# Patient Record
Sex: Male | Born: 1970
Health system: Southern US, Community
[De-identification: ages and names within clinical notes are randomized; demographics above are authoritative.]

## PROBLEM LIST (undated history)

## (undated) DIAGNOSIS — R079 Chest pain, unspecified: Secondary | ICD-10-CM

## (undated) DIAGNOSIS — E7800 Pure hypercholesterolemia, unspecified: Secondary | ICD-10-CM

## (undated) DIAGNOSIS — N289 Disorder of kidney and ureter, unspecified: Secondary | ICD-10-CM

## (undated) DIAGNOSIS — I10 Essential (primary) hypertension: Secondary | ICD-10-CM

## (undated) HISTORY — PX: NO PAST SURGERIES: SHX2092

## (undated) HISTORY — DX: Chest pain, unspecified: R07.9

## (undated) HISTORY — DX: Essential (primary) hypertension: I10

---

## 2009-06-13 ENCOUNTER — Emergency Department (HOSPITAL_BASED_OUTPATIENT_CLINIC_OR_DEPARTMENT_OTHER): Admission: EM | Admit: 2009-06-13 | Discharge: 2009-06-13 | Payer: Self-pay | Admitting: Emergency Medicine

## 2010-11-17 LAB — CULTURE, ROUTINE-ABSCESS

## 2015-12-10 DIAGNOSIS — N181 Chronic kidney disease, stage 1: Secondary | ICD-10-CM | POA: Diagnosis not present

## 2015-12-10 DIAGNOSIS — E559 Vitamin D deficiency, unspecified: Secondary | ICD-10-CM | POA: Diagnosis not present

## 2015-12-13 DIAGNOSIS — E559 Vitamin D deficiency, unspecified: Secondary | ICD-10-CM | POA: Diagnosis not present

## 2015-12-13 DIAGNOSIS — N181 Chronic kidney disease, stage 1: Secondary | ICD-10-CM | POA: Diagnosis not present

## 2015-12-13 DIAGNOSIS — I10 Essential (primary) hypertension: Secondary | ICD-10-CM | POA: Diagnosis not present

## 2016-07-03 DIAGNOSIS — N181 Chronic kidney disease, stage 1: Secondary | ICD-10-CM | POA: Diagnosis not present

## 2016-07-03 DIAGNOSIS — E559 Vitamin D deficiency, unspecified: Secondary | ICD-10-CM | POA: Diagnosis not present

## 2016-07-11 DIAGNOSIS — E559 Vitamin D deficiency, unspecified: Secondary | ICD-10-CM | POA: Diagnosis not present

## 2016-07-11 DIAGNOSIS — N181 Chronic kidney disease, stage 1: Secondary | ICD-10-CM | POA: Diagnosis not present

## 2016-07-11 DIAGNOSIS — I10 Essential (primary) hypertension: Secondary | ICD-10-CM | POA: Diagnosis not present

## 2016-07-11 DIAGNOSIS — R809 Proteinuria, unspecified: Secondary | ICD-10-CM | POA: Diagnosis not present

## 2016-12-28 DIAGNOSIS — N181 Chronic kidney disease, stage 1: Secondary | ICD-10-CM | POA: Diagnosis not present

## 2016-12-28 DIAGNOSIS — E559 Vitamin D deficiency, unspecified: Secondary | ICD-10-CM | POA: Diagnosis not present

## 2016-12-28 DIAGNOSIS — R809 Proteinuria, unspecified: Secondary | ICD-10-CM | POA: Diagnosis not present

## 2017-01-02 DIAGNOSIS — N181 Chronic kidney disease, stage 1: Secondary | ICD-10-CM | POA: Diagnosis not present

## 2017-01-02 DIAGNOSIS — E559 Vitamin D deficiency, unspecified: Secondary | ICD-10-CM | POA: Diagnosis not present

## 2017-01-02 DIAGNOSIS — I10 Essential (primary) hypertension: Secondary | ICD-10-CM | POA: Diagnosis not present

## 2017-01-02 DIAGNOSIS — R809 Proteinuria, unspecified: Secondary | ICD-10-CM | POA: Diagnosis not present

## 2017-04-17 DIAGNOSIS — M62830 Muscle spasm of back: Secondary | ICD-10-CM | POA: Diagnosis not present

## 2017-04-17 DIAGNOSIS — I10 Essential (primary) hypertension: Secondary | ICD-10-CM | POA: Diagnosis not present

## 2017-04-17 DIAGNOSIS — M546 Pain in thoracic spine: Secondary | ICD-10-CM | POA: Diagnosis not present

## 2017-10-26 ENCOUNTER — Other Ambulatory Visit: Payer: Self-pay

## 2017-10-26 ENCOUNTER — Emergency Department (HOSPITAL_BASED_OUTPATIENT_CLINIC_OR_DEPARTMENT_OTHER)
Admission: EM | Admit: 2017-10-26 | Discharge: 2017-10-26 | Disposition: A | Discharge status: Home / Self Care | Payer: BLUE CROSS/BLUE SHIELD | Attending: Emergency Medicine | Admitting: Emergency Medicine

## 2017-10-26 ENCOUNTER — Encounter (HOSPITAL_BASED_OUTPATIENT_CLINIC_OR_DEPARTMENT_OTHER): Payer: Self-pay | Admitting: Emergency Medicine

## 2017-10-26 ENCOUNTER — Emergency Department (HOSPITAL_BASED_OUTPATIENT_CLINIC_OR_DEPARTMENT_OTHER): Payer: BLUE CROSS/BLUE SHIELD

## 2017-10-26 DIAGNOSIS — Z79899 Other long term (current) drug therapy: Secondary | ICD-10-CM | POA: Diagnosis not present

## 2017-10-26 DIAGNOSIS — I10 Essential (primary) hypertension: Secondary | ICD-10-CM | POA: Insufficient documentation

## 2017-10-26 DIAGNOSIS — R002 Palpitations: Secondary | ICD-10-CM

## 2017-10-26 DIAGNOSIS — R42 Dizziness and giddiness: Secondary | ICD-10-CM | POA: Diagnosis not present

## 2017-10-26 LAB — TSH: TSH: 0.901 u[IU]/mL (ref 0.350–4.500)

## 2017-10-26 LAB — CBC WITH DIFFERENTIAL/PLATELET
BASOS PCT: 1 %
Basophils Absolute: 0 10*3/uL (ref 0.0–0.1)
EOS ABS: 0.5 10*3/uL (ref 0.0–0.7)
Eosinophils Relative: 8 %
HEMATOCRIT: 42.5 % (ref 39.0–52.0)
HEMOGLOBIN: 15.4 g/dL (ref 13.0–17.0)
LYMPHS ABS: 2.7 10*3/uL (ref 0.7–4.0)
Lymphocytes Relative: 46 %
MCH: 30.5 pg (ref 26.0–34.0)
MCHC: 36.2 g/dL — AB (ref 30.0–36.0)
MCV: 84.2 fL (ref 78.0–100.0)
MONOS PCT: 12 %
Monocytes Absolute: 0.7 10*3/uL (ref 0.1–1.0)
NEUTROS ABS: 1.9 10*3/uL (ref 1.7–7.7)
Neutrophils Relative %: 33 %
Platelets: 270 10*3/uL (ref 150–400)
RBC: 5.05 MIL/uL (ref 4.22–5.81)
RDW: 12.9 % (ref 11.5–15.5)
WBC: 5.8 10*3/uL (ref 4.0–10.5)

## 2017-10-26 LAB — BASIC METABOLIC PANEL
ANION GAP: 10 (ref 5–15)
BUN: 13 mg/dL (ref 6–20)
CALCIUM: 9 mg/dL (ref 8.9–10.3)
CHLORIDE: 103 mmol/L (ref 101–111)
CO2: 24 mmol/L (ref 22–32)
CREATININE: 1.4 mg/dL — AB (ref 0.61–1.24)
GFR calc non Af Amer: 59 mL/min — ABNORMAL LOW (ref >60)
Glucose, Bld: 126 mg/dL — ABNORMAL HIGH (ref 65–99)
Potassium: 3.1 mmol/L — ABNORMAL LOW (ref 3.5–5.1)
SODIUM: 137 mmol/L (ref 135–145)

## 2017-10-26 LAB — TROPONIN I: Troponin I: <0.03 ng/mL (ref <0.03)

## 2017-10-26 LAB — MAGNESIUM: MAGNESIUM: 1.9 mg/dL (ref 1.7–2.4)

## 2017-10-26 MED ORDER — SODIUM CHLORIDE 0.9 % IV BOLUS (SEPSIS)
1000.0000 mL | Freq: Once | INTRAVENOUS | Status: AC
  Administered 2017-10-26: 1000 mL via INTRAVENOUS

## 2017-10-26 MED ORDER — POTASSIUM CHLORIDE CRYS ER 20 MEQ PO TBCR
20.0000 meq | EXTENDED_RELEASE_TABLET | Freq: Once | ORAL | Status: AC
Start: 2017-10-26 — End: 2017-10-26
  Administered 2017-10-26: 20 meq via ORAL
  Filled 2017-10-26: qty 1

## 2017-10-26 NOTE — ED Notes (Signed)
Pt ambulated to restroom with no difficulty.

## 2017-10-26 NOTE — ED Provider Notes (Signed)
[facility] EMERGENCY DEPARTMENT Provider Note   CSN: [id number] Arrival date & time: 10/26/17  1151     History   Chief Complaint Chief Complaint  Patient presents with  . Palpitations    HPI Scott Mcpherson is a 47 y.o. male.  He has a past medical history of hypertension.  He comes to the emergency department complaining of acute onset about 30 minutes ago of palpitations.  States they occurred at rest he felt very uncomfortable sensation in his chest with a hard noticeable heartbeat that lasted for a few minutes.  It spontaneously resolved and then recurred once again a few minutes later.  It is not come back since then.  It was associated with some lightheadedness that he still feeling.  There was no syncope.  No prior history of same.  He states everybody in the house has had some viral illness and he has been taking TheraFlu and Alka-Seltzer cold.   The history is provided by the patient.  Palpitations   This is a new problem. The current episode started less than 1 hour ago. The problem has been resolved. The problem is associated with an unknown factor. Associated symptoms include dizziness. Pertinent negatives include no diaphoresis, no fever, no chest pain, no syncope, no abdominal pain, no nausea, no vomiting, no back pain, no cough and no shortness of breath. He has tried bed rest for the symptoms. The treatment provided moderate relief. Risk factors include being male. His past medical history does not include anemia, heart disease, hyperthyroidism or valve disorder.    Past Medical History:  Diagnosis Date  . Benign hypertension   . Chest pain     There are no active problems to display for this patient.   History reviewed. No pertinent surgical history.     Home Medications    Prior to Admission medications   Medication Sig Start Date End Date Taking? Authorizing Provider  amLODipine (NORVASC) 10 MG tablet Take 10 mg by mouth daily.   Yes [provider]  losartan (COZAAR) 25 MG tablet Take 25 mg by mouth daily.   Yes [provider]  HYDROCHLOROTHIAZIDE PO Take by mouth. HCTZ 12.5 MG Tablet 1 tablet q AM    [provider]  OVER THE COUNTER MEDICATION mega men sport mvi daily,rapid drive amino complex 3 to 4 times a week,wheybolic extreme,pure protein bars as directed    [provider]    Family History Family History  Problem Relation Age of Onset  . Hypertension Mother   . Hypertension Father   . Hypertension Brother   . Hypertension Brother   . Diabetes Brother   . Cystic fibrosis Brother     Social History Social History   Tobacco Use  . Smoking status: Never Smoker  . Smokeless tobacco: Never Used  Substance Use Topics  . Alcohol use: Yes    Alcohol/week: 1.2 oz    Types: 2 Glasses of wine per week    Frequency: Never  . Drug use: No     Allergies   Other   Review of Systems Review of Systems  Constitutional: Negative for chills, diaphoresis and fever.  HENT: Negative for ear pain and sore throat.   Eyes: Negative for pain and visual disturbance.  Respiratory: Negative for cough and shortness of breath.   Cardiovascular: Positive for palpitations. Negative for chest pain and syncope.  Gastrointestinal: Negative for abdominal pain, nausea and vomiting.  Genitourinary: Negative for dysuria and hematuria.  Musculoskeletal: Negative for arthralgias and back pain.  Skin: Negative for color change and rash.  Neurological: Positive for dizziness. Negative for seizures and syncope.  All other systems reviewed and are negative.    Physical Exam Updated Vital Signs BP (!) 163/98 (BP Location: Left Arm)   Pulse 73   Temp 98.5 F (36.9 C) (Oral)   Resp 18   Ht 6\' 5"  (1.956 m)   Wt 108 kg (238 lb)   SpO2 100%   BMI 28.22 kg/m   Physical Exam  Constitutional: He appears well-developed and well-nourished.  HENT:  Head: Normocephalic and atraumatic.  Eyes:  Conjunctivae are normal.  Neck: Neck supple.  Cardiovascular: Normal rate and regular rhythm.  No murmur heard. Pulses:      Radial pulses are 2+ on the right side, and 2+ on the left side.       Dorsalis pedis pulses are 2+ on the right side, and 2+ on the left side.  Pulmonary/Chest: Effort normal and breath sounds normal. No respiratory distress.  Abdominal: Soft. There is no tenderness.  Musculoskeletal: He exhibits no edema or tenderness.  Neurological: He is alert.  Skin: Skin is warm and dry. Capillary refill takes less than 2 seconds.  Psychiatric: He has a normal mood and affect.  Nursing note and vitals reviewed.    ED Treatments / Results  Labs (all labs ordered are listed, but only abnormal results are displayed) Labs Reviewed  BASIC METABOLIC PANEL - Abnormal; Notable for the following components:      Result Value   Potassium 3.1 (*)    Glucose, Bld 126 (*)    Creatinine, Ser 1.40 (*)    GFR calc non Af Amer 59 (*)    All other components within normal limits  CBC WITH DIFFERENTIAL/PLATELET - Abnormal; Notable for the following components:   MCHC 36.2 (*)    All other components within normal limits  TROPONIN I  MAGNESIUM  TSH  TROPONIN I    EKG  EKG Interpretation  Date/Time:  Friday October 26 2017 12:05:12 EDT Ventricular Rate:  67 PR Interval:    QRS Duration: 87 QT Interval:  383 QTC Calculation: 405 R Axis:   102 Text Interpretation:  Sinus rhythm Right axis deviation Borderline repolarization abnormality diffuse t wave inversions no prior to compare with Confirmed by Meridee ScoreButler, Michael [id number]) on 10/26/2017 12:10:38 PM       Radiology Dg Chest 2 View  Result Date: 10/26/2017 CLINICAL DATA:  Palpitations EXAM: CHEST - 2 VIEW COMPARISON:  None. FINDINGS: Normal heart size. Lungs clear. No pneumothorax. No pleural effusion. IMPRESSION: No active cardiopulmonary disease. Electronically Signed   By: Jolaine ClickArthur  Hoss M.D.   On: 10/26/2017 12:31     Procedures Procedures (including critical care time)  Medications Ordered in ED Medications  sodium chloride 0.9 % bolus 1,000 mL (not administered)     Initial Impression / Assessment and Plan / ED Course  I have reviewed the triage vital signs and the nursing notes.  Pertinent labs & imaging results that were available during my care of the patient were reviewed by me and considered in my medical decision making (see chart for details).  Clinical Course as of Oct 27 1921  Fri Oct 26, 2017  1339 I evaluated the patient's EKG and he does have diffuse T wave inversions both inferiorly and anteriorly.  There are no prior ones.  His potassium was a little low and he has been on much stimulants so  he is got multiple reasons to have an arrhythmia but I do feel like he at least deserves to troponins.  I reviewed this with the patient and he also remembers hearing that his EKG was abnormal prior when he did an EKG for insurance.  States he seen a cardiologist had a negative stress test but that was 5 years ago plus.  He is comfortable with getting a second troponin and if that is negative then we would refer him on to cardiology for consideration of a Holter monitor and further evaluation of this abnormal EKG.  [MB]  1519 Repeat EKG normal sinus rhythm at 59 with right axis deviation and T wave inversions anteriorly and flattening inferiorly similar to prior EKG.  [MB]  1550 Second troponin negative.  I reviewed all of the results with the patient and he is comfortable going home and following up with his doctor and cardiologist.  He understands to return if any worsening symptoms.  [MB]    Clinical Course User Index [MB] Terrilee Files, MD     Final Clinical Impressions(s) / ED Diagnoses   Final diagnoses:  Palpitations    ED Discharge Orders    None       Terrilee Files, MD 10/27/17 1924

## 2017-10-26 NOTE — ED Notes (Signed)
NAD at this time. Pt is stable and going home.  

## 2017-10-26 NOTE — Discharge Instructions (Signed)
Your evaluated in the emergency department for 2 episodes of palpitations.  Your workup was significant for a slightly low potassium.  You were given some potassium to help that.  Also your EKG showed some abnormality that will need to be followed up by your primary care doctor and likely cardiology.  The blood work did not show any evidence of any cardiac injury.  We recommend you take your regular medications and call your doctor on Monday for follow-up.  If your palpitations symptoms recur or any other concerns before then please return to the emergency department for reevaluation.  He should also be careful of other over-the-counter medications because many of the cold preparations can cause the heart to have some irregularity to it.

## 2017-10-26 NOTE — ED Triage Notes (Signed)
Patient states that after he ate breakfast he felt like his heart was beating fast. Reports that it is better at this time

## 2017-11-16 ENCOUNTER — Ambulatory Visit (INDEPENDENT_AMBULATORY_CARE_PROVIDER_SITE_OTHER): Payer: BLUE CROSS/BLUE SHIELD | Admitting: Cardiology

## 2017-11-16 ENCOUNTER — Encounter: Payer: Self-pay | Admitting: Cardiology

## 2017-11-16 VITALS — BP 136/76 | HR 70 | Ht 77.0 in | Wt 251.0 lb

## 2017-11-16 DIAGNOSIS — N182 Chronic kidney disease, stage 2 (mild): Secondary | ICD-10-CM | POA: Diagnosis not present

## 2017-11-16 DIAGNOSIS — N189 Chronic kidney disease, unspecified: Secondary | ICD-10-CM | POA: Insufficient documentation

## 2017-11-16 DIAGNOSIS — R0683 Snoring: Secondary | ICD-10-CM

## 2017-11-16 DIAGNOSIS — R002 Palpitations: Secondary | ICD-10-CM | POA: Diagnosis not present

## 2017-11-16 DIAGNOSIS — R9431 Abnormal electrocardiogram [ECG] [EKG]: Secondary | ICD-10-CM

## 2017-11-16 NOTE — Patient Instructions (Addendum)
Medication Instructions:  Your physician recommends that you continue on your current medications as directed. Please refer to the Current Medication list given to you today.   Labwork: Your physician recommends that you return for lab work when you come back for your event recorder appointment: lipid panel. Please fast before lab work.   Testing/Procedures: Your physician has requested that you have an echocardiogram. Echocardiography is a painless test that uses sound waves to create images of your heart. It provides your doctor with information about the size and shape of your heart and how well your heart's chambers and valves are working. This procedure takes approximately one hour. There are no restrictions for this procedure.  Your physician has recommended that you wear an event monitor. Event monitors are medical devices that record the heart's electrical activity. Doctors most often us these monitors to diagnose arrhythmias. Arrhythmias are problems with the speed or rhythm of the heartbeat. The monitor is a small, portable device. You can wear one while you do your normal daily activities. This is usually used to diagnose what is causing palpitations/syncope (passing out). Wear 30 days.   Follow-Up: Your physician recommends that you schedule a follow-up appointment in: 6 weeks.   Any Other Special Instructions Will Be Listed Below (If Applicable).  You have been referred to have a sleep study. You will be contacted to schedule this appointment.    If you need a refill on your cardiac medications before your next appointment, please call your pharmacy.

## 2017-11-16 NOTE — Progress Notes (Signed)
Cardiology Consultation:    Date:  11/16/2017   ID:  Scott Mcpherson, DOB [birth year], MRN [id number]  PCP:  Patient, No Pcp Per  Cardiologist:  Gypsy Balsamobert Gamble Enderle, MD   Referring MD: No ref. provider found   Chief Complaint  Patient presents with  . Tachycardia    Follow up from ED,  had episode yesterday, and this morning  . Dizziness  . Hypertension  I had palpitations  History of Present Illness:    Scott KiltsDamien L Druckenmiller is a 47 y.o. male who is being seen today for the evaluation of palpitations at the request of No ref. provider found.  Last week he was sick with a cold.  He took some medication which included DayQuil.  After that he was sitting in the diet and started being aware of his heart beating very forcefully he did not is especially fast but simply forcefully he got up to try to walk around was a little dizzy there was no shortness of breath no sweating associated with this sensation he did not feel well.  Eventually decided to go to the emergency room.  In the emergency room he was examined and batteries of tests were done all were negative his thyroid was normal troponin was negative.  He was sent home.  However yesterday again he started experiencing this forceful beating in his chest.  He became more than decided to come and be seen.  Apparently 2 years ago he applied for life insurance and he had to pay higher premium because of abnormalities of EKG.  EKG done in the emergency room showed normal sinus rhythm normal P interval repolarization abnormalities.  With some nonspecific T wave changes.  Does not exercise on the regular basis but since he started having this problem he started walking on the regular basis have no difficulty doing it. He snores a lot but does not know if he stops breathing at night.  Past Medical History:  Diagnosis Date  . Benign hypertension   . Chest pain     Past Surgical History:  Procedure Laterality Date  . NO PAST SURGERIES      Current  Medications: Current Meds  Medication Sig  . amLODipine-benazepril (LOTREL) 10-40 MG capsule Take 1 capsule by mouth daily.  Marland Kitchen. losartan (COZAAR) 25 MG tablet Take 25 mg by mouth daily.     Allergies:   Other and Pollen extract   Social History   Socioeconomic History  . Marital status: Married    Spouse name: Not on file  . Number of children: Not on file  . Years of education: Not on file  . Highest education level: Not on file  Occupational History  . Not on file  Social Needs  . Financial resource strain: Not on file  . Food insecurity:    Worry: Not on file    Inability: Not on file  . Transportation needs:    Medical: Not on file    Non-medical: Not on file  Tobacco Use  . Smoking status: Never Smoker  . Smokeless tobacco: Never Used  Substance and Sexual Activity  . Alcohol use: Yes    Alcohol/week: 1.2 oz    Types: 2 Glasses of wine per week    Frequency: Never  . Drug use: No  . Sexual activity: Not on file  Lifestyle  . Physical activity:    Days per week: Not on file    Minutes per session: Not on file  . Stress: Not  on file  Relationships  . Social connections:    Talks on phone: Not on file    Gets together: Not on file    Attends religious service: Not on file    Active member of club or organization: Not on file    Attends meetings of clubs or organizations: Not on file    Relationship status: Not on file  Other Topics Concern  . Not on file  Social History Narrative  . Not on file     Family History: The patient's family history includes Cystic fibrosis in his brother; Diabetes in his brother; Hypertension in his brother, brother, father, and mother. ROS:   Please see the history of present illness.    All 14 point review of systems negative except as described per history of present illness.  EKGs/Labs/Other Studies Reviewed:    The following studies were reviewed today:     Recent Labs: 10/26/2017: BUN 13; Creatinine, Ser 1.40;  Hemoglobin 15.4; Magnesium 1.9; Platelets 270; Potassium 3.1; Sodium 137; TSH 0.901  Recent Lipid Panel No results found for: CHOL, TRIG, HDL, CHOLHDL, VLDL, LDLCALC, LDLDIRECT  Physical Exam:    VS:  BP 136/76 (BP Location: Left Arm)   Pulse 70   Ht 6\' 5"  (1.956 m)   Wt 251 lb (113.9 kg)   SpO2 97%   BMI 29.76 kg/m     Wt Readings from Last 3 Encounters:  11/16/17 251 lb (113.9 kg)  10/26/17 238 lb (108 kg)     GEN:  Well nourished, well developed in no acute distress HEENT: Normal NECK: No JVD; No carotid bruits LYMPHATICS: No lymphadenopathy CARDIAC: RRR, no murmurs, no rubs, no gallops RESPIRATORY:  Clear to auscultation without rales, wheezing or rhonchi  ABDOMEN: Soft, non-tender, non-distended MUSCULOSKELETAL:  No edema; No deformity  SKIN: Warm and dry NEUROLOGIC:  Alert and oriented x 3 PSYCHIATRIC:  Normal affect   ASSESSMENT:    1. Palpitations   2. Abnormal EKG   3. Chronic renal failure, stage 2 (mild)   4. Snoring    PLAN:    In order of problems listed above:  1. Palpitations: I will ask him to wear event recorder to see if there is any significant abnormality.  I suspect it may be related simply to the fact that he had a cold may be was somewhat dehydrated and was taking also decongestant.  However with abnormality of the EKG I think it would be reasonable to perform monitor. 2. Abnormal EKG: He will be scheduled to have echocardiogram to assess left ventricular ejection fraction. 3. Chronic renal failure: Stable followed by nephrology.  On ARB. 4. Snoring: We will schedule him to have a sleep study.   Medication Adjustments/Labs and Tests Ordered: Current medicines are reviewed at length with the patient today.  Concerns regarding medicines are outlined above.  Orders Placed This Encounter  Procedures  . Lipid Profile  . Cardiac event monitor  . ECHOCARDIOGRAM COMPLETE   No orders of the defined types were placed in this  encounter.   Signed, Georgeanna Lea, MD, [facility]. 11/16/2017 1:03 PM    [facility]

## 2017-11-20 DIAGNOSIS — R002 Palpitations: Secondary | ICD-10-CM | POA: Diagnosis not present

## 2017-11-21 LAB — LIPID PANEL
CHOLESTEROL TOTAL: 234 mg/dL — AB (ref 100–199)
Chol/HDL Ratio: 3.3 ratio (ref 0.0–5.0)
HDL: 70 mg/dL (ref >39)
LDL Calculated: 146 mg/dL — ABNORMAL HIGH (ref 0–99)
Triglycerides: 91 mg/dL (ref 0–149)
VLDL Cholesterol Cal: 18 mg/dL (ref 5–40)

## 2018-01-01 ENCOUNTER — Ambulatory Visit (INDEPENDENT_AMBULATORY_CARE_PROVIDER_SITE_OTHER): Payer: BLUE CROSS/BLUE SHIELD | Admitting: Cardiology

## 2018-01-01 ENCOUNTER — Encounter: Payer: Self-pay | Admitting: Cardiology

## 2018-01-01 VITALS — BP 130/78 | HR 75 | Ht 77.0 in | Wt 253.0 lb

## 2018-01-01 DIAGNOSIS — R002 Palpitations: Secondary | ICD-10-CM | POA: Diagnosis not present

## 2018-01-01 DIAGNOSIS — R9431 Abnormal electrocardiogram [ECG] [EKG]: Secondary | ICD-10-CM | POA: Diagnosis not present

## 2018-01-01 NOTE — Patient Instructions (Signed)
Medication Instructions:  Your physician recommends that you continue on your current medications as directed. Please refer to the Current Medication list given to you today.   Labwork: None  Testing/Procedures: Your physician has requested that you have an echocardiogram. Echocardiography is a painless test that uses sound waves to create images of your heart. It provides your doctor with information about the size and shape of your heart and how well your heart's chambers and valves are working. This procedure takes approximately one hour. There are no restrictions for this procedure.  Follow-Up: Your physician wants you to follow-up in: 3 months. You will receive a reminder letter in the mail two months in advance. If you don't receive a letter, please call our office to schedule the follow-up appointment.   If you need a refill on your cardiac medications before your next appointment, please call your pharmacy.   Thank you for choosing [facility]! Airyonna Franklyn, RN [phone]    

## 2018-01-01 NOTE — Progress Notes (Signed)
Cardiology Office Note:    Date:  01/01/2018   ID:  Scott Mcpherson, DOB [birth year], MRN [id number]  PCP:  Patient, No Pcp Per  Cardiologist:  Gypsy Balsam, MD    Referring MD: No ref. provider found   Chief Complaint  Patient presents with  . 6 week follow up  Better  History of Present Illness:    Scott Mcpherson is a 47 y.o. male with palpitations.  He wear Holter monitor but did not show any significant arrhythmia.  Echocardiogram still was not done awaiting for the test.  Overall he is doing much better he started exercising again have no difficulty doing it  Past Medical History:  Diagnosis Date  . Benign hypertension   . Chest pain     Past Surgical History:  Procedure Laterality Date  . NO PAST SURGERIES      Current Medications: Current Meds  Medication Sig  . amLODipine-benazepril (LOTREL) 10-40 MG capsule Take 1 capsule by mouth daily.  Marland Kitchen loratadine (CLARITIN) 10 MG tablet Take 10 mg by mouth daily.  Marland Kitchen losartan (COZAAR) 25 MG tablet Take 25 mg by mouth daily.     Allergies:   Other and Pollen extract   Social History   Socioeconomic History  . Marital status: Married    Spouse name: Not on file  . Number of children: Not on file  . Years of education: Not on file  . Highest education level: Not on file  Occupational History  . Not on file  Social Needs  . Financial resource strain: Not on file  . Food insecurity:    Worry: Not on file    Inability: Not on file  . Transportation needs:    Medical: Not on file    Non-medical: Not on file  Tobacco Use  . Smoking status: Never Smoker  . Smokeless tobacco: Never Used  Substance and Sexual Activity  . Alcohol use: Yes    Alcohol/week: 1.2 oz    Types: 2 Glasses of wine per week    Frequency: Never  . Drug use: No  . Sexual activity: Not on file  Lifestyle  . Physical activity:    Days per week: Not on file    Minutes per session: Not on file  . Stress: Not on file  Relationships  .  Social connections:    Talks on phone: Not on file    Gets together: Not on file    Attends religious service: Not on file    Active member of club or organization: Not on file    Attends meetings of clubs or organizations: Not on file    Relationship status: Not on file  Other Topics Concern  . Not on file  Social History Narrative  . Not on file     Family History: The patient's family history includes Cystic fibrosis in his brother; Diabetes in his brother; Hypertension in his brother, brother, father, and mother. ROS:   Please see the history of present illness.    All 14 point review of systems negative except as described per history of present illness  EKGs/Labs/Other Studies Reviewed:      Recent Labs: 10/26/2017: BUN 13; Creatinine, Ser 1.40; Hemoglobin 15.4; Magnesium 1.9; Platelets 270; Potassium 3.1; Sodium 137; TSH 0.901  Recent Lipid Panel    Component Value Date/Time   CHOL 234 (H) 11/20/2017 0840   TRIG 91 11/20/2017 0840   HDL 70 11/20/2017 0840   CHOLHDL 3.3 11/20/2017 0840  LDLCALC 146 (H) 11/20/2017 0840    Physical Exam:    VS:  BP 130/78   Pulse 75   Ht  (1.956 m)   Wt 253 lb (114.8 kg)   SpO2 97%   BMI 30.00 kg/m     Wt Readings from Last 3 Encounters:  01/01/18 253 lb (114.8 kg)  11/16/17 251 lb (113.9 kg)  10/26/17 238 lb (108 kg)     GEN:  Well nourished, well developed in no acute distress HEENT: Normal NECK: No JVD; No carotid bruits LYMPHATICS: No lymphadenopathy CARDIAC: RRR, no murmurs, no rubs, no gallops RESPIRATORY:  Clear to auscultation without rales, wheezing or rhonchi  ABDOMEN: Soft, non-tender, non-distended MUSCULOSKELETAL:  No edema; No deformity  SKIN: Warm and dry LOWER EXTREMITIES: no swelling NEUROLOGIC:  Alert and oriented x 3 PSYCHIATRIC:  Normal affect   ASSESSMENT:    1. Palpitations   2. Abnormal EKG    PLAN:    In order of problems listed above:  1. Palpitations: Denies having any event  recorder did not show any significant arrhythmia we will continue monitoring as a part of evaluation I will ask him to have echocardiogram done. 2. Abnormal EKG: We will do echocardiogram.  Him back in 3 months sooner if he get a problem   Medication Adjustments/Labs and Tests Ordered: Current medicines are reviewed at length with the patient today.  Concerns regarding medicines are outlined above.  No orders of the defined types were placed in this encounter.  Medication changes: No orders of the defined types were placed in this encounter.   Signed, Georgeanna Lea, MD, [facility] 01/01/2018 9:05 AM    [facility]

## 2018-01-02 ENCOUNTER — Telehealth: Payer: Self-pay | Admitting: Cardiology

## 2018-01-02 NOTE — Telephone Encounter (Signed)
Please call patient she needs to speak to you about scheduling his echo with another place.

## 2018-01-02 NOTE — Telephone Encounter (Signed)
Patient states he has been calling around trying to find the most affordable facility with his insurance company to perform his echocardiogram. He is interested in having this done at [facility] in [place]; however, he wants to see how much the cost would be to have the echocardiogram done here. Advised patient to call our billing department to get more information regarding cost. Patient states he will call back to let us know where he wants to schedule his echocardiogram. No further questions.

## 2018-02-26 DIAGNOSIS — E559 Vitamin D deficiency, unspecified: Secondary | ICD-10-CM | POA: Diagnosis not present

## 2018-02-26 DIAGNOSIS — I10 Essential (primary) hypertension: Secondary | ICD-10-CM | POA: Diagnosis not present

## 2018-02-26 DIAGNOSIS — N181 Chronic kidney disease, stage 1: Secondary | ICD-10-CM | POA: Diagnosis not present

## 2018-02-26 DIAGNOSIS — R809 Proteinuria, unspecified: Secondary | ICD-10-CM | POA: Diagnosis not present

## 2018-03-27 DIAGNOSIS — N181 Chronic kidney disease, stage 1: Secondary | ICD-10-CM | POA: Diagnosis not present

## 2018-11-20 DIAGNOSIS — N181 Chronic kidney disease, stage 1: Secondary | ICD-10-CM | POA: Diagnosis not present

## 2018-11-21 DIAGNOSIS — N181 Chronic kidney disease, stage 1: Secondary | ICD-10-CM | POA: Diagnosis not present

## 2018-11-21 DIAGNOSIS — E559 Vitamin D deficiency, unspecified: Secondary | ICD-10-CM | POA: Diagnosis not present

## 2018-11-21 DIAGNOSIS — R809 Proteinuria, unspecified: Secondary | ICD-10-CM | POA: Diagnosis not present

## 2018-11-21 DIAGNOSIS — I10 Essential (primary) hypertension: Secondary | ICD-10-CM | POA: Diagnosis not present

## 2019-02-17 DIAGNOSIS — R809 Proteinuria, unspecified: Secondary | ICD-10-CM | POA: Diagnosis not present

## 2019-02-17 DIAGNOSIS — N181 Chronic kidney disease, stage 1: Secondary | ICD-10-CM | POA: Diagnosis not present

## 2019-02-17 DIAGNOSIS — E559 Vitamin D deficiency, unspecified: Secondary | ICD-10-CM | POA: Diagnosis not present

## 2019-02-17 DIAGNOSIS — N182 Chronic kidney disease, stage 2 (mild): Secondary | ICD-10-CM | POA: Diagnosis not present

## 2019-02-17 DIAGNOSIS — I10 Essential (primary) hypertension: Secondary | ICD-10-CM | POA: Diagnosis not present

## 2019-05-13 DIAGNOSIS — L03032 Cellulitis of left toe: Secondary | ICD-10-CM | POA: Diagnosis not present

## 2019-05-13 DIAGNOSIS — L600 Ingrowing nail: Secondary | ICD-10-CM | POA: Diagnosis not present

## 2019-07-08 DIAGNOSIS — E559 Vitamin D deficiency, unspecified: Secondary | ICD-10-CM | POA: Diagnosis not present

## 2019-07-08 DIAGNOSIS — N182 Chronic kidney disease, stage 2 (mild): Secondary | ICD-10-CM | POA: Diagnosis not present

## 2019-07-08 DIAGNOSIS — R809 Proteinuria, unspecified: Secondary | ICD-10-CM | POA: Diagnosis not present

## 2019-07-08 DIAGNOSIS — I129 Hypertensive chronic kidney disease with stage 1 through stage 4 chronic kidney disease, or unspecified chronic kidney disease: Secondary | ICD-10-CM | POA: Diagnosis not present

## 2019-12-11 DIAGNOSIS — N182 Chronic kidney disease, stage 2 (mild): Secondary | ICD-10-CM | POA: Diagnosis not present

## 2019-12-11 DIAGNOSIS — E559 Vitamin D deficiency, unspecified: Secondary | ICD-10-CM | POA: Diagnosis not present

## 2019-12-17 DIAGNOSIS — N1831 Chronic kidney disease, stage 3a: Secondary | ICD-10-CM | POA: Diagnosis not present

## 2019-12-17 DIAGNOSIS — I129 Hypertensive chronic kidney disease with stage 1 through stage 4 chronic kidney disease, or unspecified chronic kidney disease: Secondary | ICD-10-CM | POA: Diagnosis not present

## 2019-12-17 DIAGNOSIS — R809 Proteinuria, unspecified: Secondary | ICD-10-CM | POA: Diagnosis not present

## 2019-12-19 ENCOUNTER — Other Ambulatory Visit (HOSPITAL_COMMUNITY): Payer: Self-pay | Admitting: Nephrology

## 2019-12-19 DIAGNOSIS — I129 Hypertensive chronic kidney disease with stage 1 through stage 4 chronic kidney disease, or unspecified chronic kidney disease: Secondary | ICD-10-CM

## 2019-12-19 DIAGNOSIS — N1831 Chronic kidney disease, stage 3a: Secondary | ICD-10-CM

## 2019-12-19 DIAGNOSIS — R809 Proteinuria, unspecified: Secondary | ICD-10-CM

## 2019-12-31 ENCOUNTER — Other Ambulatory Visit: Payer: Self-pay | Admitting: Student

## 2020-01-01 ENCOUNTER — Encounter (HOSPITAL_COMMUNITY): Payer: Self-pay

## 2020-01-01 ENCOUNTER — Ambulatory Visit (HOSPITAL_COMMUNITY)
Admission: RE | Admit: 2020-01-01 | Discharge: 2020-01-01 | Disposition: A | Discharge status: Home / Self Care | Payer: BC Managed Care – PPO | Source: Ambulatory Visit | Attending: Nephrology | Admitting: Nephrology

## 2020-01-01 ENCOUNTER — Other Ambulatory Visit: Payer: Self-pay

## 2020-01-01 DIAGNOSIS — Z7901 Long term (current) use of anticoagulants: Secondary | ICD-10-CM | POA: Diagnosis not present

## 2020-01-01 DIAGNOSIS — R809 Proteinuria, unspecified: Secondary | ICD-10-CM | POA: Diagnosis not present

## 2020-01-01 DIAGNOSIS — Z79899 Other long term (current) drug therapy: Secondary | ICD-10-CM | POA: Insufficient documentation

## 2020-01-01 DIAGNOSIS — N1831 Chronic kidney disease, stage 3a: Secondary | ICD-10-CM | POA: Insufficient documentation

## 2020-01-01 DIAGNOSIS — N189 Chronic kidney disease, unspecified: Secondary | ICD-10-CM | POA: Diagnosis not present

## 2020-01-01 DIAGNOSIS — Z8249 Family history of ischemic heart disease and other diseases of the circulatory system: Secondary | ICD-10-CM | POA: Diagnosis not present

## 2020-01-01 DIAGNOSIS — I129 Hypertensive chronic kidney disease with stage 1 through stage 4 chronic kidney disease, or unspecified chronic kidney disease: Secondary | ICD-10-CM | POA: Insufficient documentation

## 2020-01-01 LAB — CBC
HCT: 44.1 % (ref 39.0–52.0)
Hemoglobin: 15.2 g/dL (ref 13.0–17.0)
MCH: 30.3 pg (ref 26.0–34.0)
MCHC: 34.5 g/dL (ref 30.0–36.0)
MCV: 88 fL (ref 80.0–100.0)
Platelets: 307 10*3/uL (ref 150–400)
RBC: 5.01 MIL/uL (ref 4.22–5.81)
RDW: 12.9 % (ref 11.5–15.5)
WBC: 7 10*3/uL (ref 4.0–10.5)
nRBC: 0 % (ref 0.0–0.2)

## 2020-01-01 LAB — PROTIME-INR
INR: 1.1 (ref 0.8–1.2)
Prothrombin Time: 13.9 seconds (ref 11.4–15.2)

## 2020-01-01 MED ORDER — MIDAZOLAM HCL 2 MG/2ML IJ SOLN
INTRAMUSCULAR | Status: AC
  Filled 2020-01-01: qty 2

## 2020-01-01 MED ORDER — GELATIN ABSORBABLE 12-7 MM EX MISC
CUTANEOUS | Status: AC
  Filled 2020-01-01: qty 1

## 2020-01-01 MED ORDER — MIDAZOLAM HCL 2 MG/2ML IJ SOLN
INTRAMUSCULAR | Status: AC | PRN
  Administered 2020-01-01: 1 mg via INTRAVENOUS

## 2020-01-01 MED ORDER — SODIUM CHLORIDE 0.9 % IV SOLN: INTRAVENOUS | Status: DC

## 2020-01-01 MED ORDER — LIDOCAINE HCL (PF) 1 % IJ SOLN
INTRAMUSCULAR | Status: AC
  Filled 2020-01-01: qty 30

## 2020-01-01 MED ORDER — FENTANYL CITRATE (PF) 100 MCG/2ML IJ SOLN
INTRAMUSCULAR | Status: AC
  Filled 2020-01-01: qty 2

## 2020-01-01 MED ORDER — FENTANYL CITRATE (PF) 100 MCG/2ML IJ SOLN
INTRAMUSCULAR | Status: AC | PRN
  Administered 2020-01-01: 50 ug via INTRAVENOUS

## 2020-01-01 NOTE — H&P (Signed)
Chief Complaint: Patient was seen in consultation today for a random renal biopsy.  Referring Physician(s): Patel,Jay  Supervising Physician: Gilmer Mor  Patient Status: [facility] - Out-pt  History of Present Illness: Scott Mcpherson is a 49 y.o. male with a past medical history significant for HTN, asthma and seasonal allergies who presents today for a random renal biopsy. Scott Mcpherson has been followed by nephrology for persistent proteinuria, at his last visit on 12/17/19 he was also noted to have further worsening of his GFR. IR has been asked to perform a random renal biopsy to further evaluate for possible FSGS.  Scott Mcpherson denies any complaints today, he is nervous about the procedure but ready to proceed. He denies any hematuria, dysuria, abdominal or flank pain. He understands the reasoning behind the procedure today and wishes to proceed as planned.  Past Medical History:  Diagnosis Date  . Benign hypertension   . Chest pain     Past Surgical History:  Procedure Laterality Date  . NO PAST SURGERIES      Allergies: Other and Pollen extract  Medications: Prior to Admission medications   Medication Sig Start Date End Date Taking? Authorizing Provider  acetaminophen (TYLENOL) 500 MG tablet Take 1,000 mg by mouth every 6 (six) hours as needed for moderate pain or headache.   Yes [provider]  amLODipine-benazepril (LOTREL) 10-40 MG capsule Take 1 capsule by mouth daily. 10/04/17  Yes [provider]  cetirizine (ZYRTEC) 10 MG tablet Take 10 mg by mouth daily.   Yes [provider]  Cholecalciferol (VITAMIN D) 50 MCG (2000 UT) tablet Take 2,000 Units by mouth 3 (three) times a week.   Yes [provider]  hydrochlorothiazide (MICROZIDE) 12.5 MG capsule Take 12.5 mg by mouth daily.   Yes [provider]  Ketotifen Fumarate (ALLERGY EYE DROPS OP) Place 1 drop into both eyes daily as needed (allergies).   Yes [provider]    losartan (COZAAR) 50 MG tablet Take 50 mg by mouth 2 (two) times daily.   Yes [provider]  Multiple Vitamin (MULTIVITAMIN WITH MINERALS) TABS tablet Take 2 tablets by mouth daily.   Yes [provider]     Family History  Problem Relation Age of Onset  . Hypertension Mother   . Hypertension Father   . Hypertension Brother   . Hypertension Brother   . Diabetes Brother   . Cystic fibrosis Brother     Social History   Socioeconomic History  . Marital status: Married    Spouse name: Not on file  . Number of children: Not on file  . Years of education: Not on file  . Highest education level: Not on file  Occupational History  . Not on file  Tobacco Use  . Smoking status: Never Smoker  . Smokeless tobacco: Never Used  Substance and Sexual Activity  . Alcohol use: Yes    Alcohol/week: 2.0 standard drinks    Types: 2 Glasses of wine per week  . Drug use: No  . Sexual activity: Not on file  Other Topics Concern  . Not on file  Social History Narrative  . Not on file   Social Determinants of Health   Financial Resource Strain:   . Difficulty of Paying Living Expenses:   Food Insecurity:   . Worried About Programme researcher, broadcasting/film/video in the Last Year:   . Barista in the Last Year:   Transportation Needs:   .  Lack of Transportation (Medical):   Marland Kitchen Lack of Transportation (Non-Medical):   Physical Activity:   . Days of Exercise per Week:   . Minutes of Exercise per Session:   Stress:   . Feeling of Stress :   Social Connections:   . Frequency of Communication with Friends and Family:   . Frequency of Social Gatherings with Friends and Family:   . Attends Religious Services:   . Active Member of Clubs or Organizations:   . Attends Archivist Meetings:   Marland Kitchen Marital Status:      Review of Systems: A 12 point ROS discussed and pertinent positives are indicated in the HPI above.  All other systems are negative.  Review of Systems   Constitutional: Negative for chills and fever.  Respiratory: Negative for cough and shortness of breath.   Cardiovascular: Negative for chest pain.  Gastrointestinal: Negative for abdominal pain, diarrhea, nausea and vomiting.  Genitourinary: Negative for dysuria and hematuria.  Musculoskeletal: Negative for back pain.  Skin: Negative for wound.  Neurological: Negative for headaches.    Vital Signs: BP 124/86   Pulse 71   Temp 98.2 F (36.8 C) (Oral)   Resp 16   Ht 6\' 5"  (1.956 m)   Wt 240 lb (108.9 kg)   SpO2 100%   BMI 28.46 kg/m   Physical Exam Vitals reviewed.  Constitutional:      General: He is not in acute distress. HENT:     Head: Normocephalic.     Mouth/Throat:     Mouth: Mucous membranes are moist.     Pharynx: Oropharynx is clear. No oropharyngeal exudate or posterior oropharyngeal erythema.  Cardiovascular:     Rate and Rhythm: Normal rate and regular rhythm.  Pulmonary:     Effort: Pulmonary effort is normal.     Breath sounds: Normal breath sounds.  Abdominal:     General: There is no distension.     Palpations: Abdomen is soft.     Tenderness: There is no abdominal tenderness.  Skin:    General: Skin is warm and dry.  Neurological:     Mental Status: He is alert and oriented to person, place, and time.  Psychiatric:        Mood and Affect: Mood normal.        Behavior: Behavior normal.        Thought Content: Thought content normal.        Judgment: Judgment normal.      MD Evaluation Airway: WNL Heart: WNL Abdomen: WNL Chest/ Lungs: WNL ASA  Classification: 2 Mallampati/Airway Score: One   Imaging: No results found.  Labs:  CBC: No results for input(s): WBC, HGB, HCT, PLT in the last 8760 hours.  COAGS: No results for input(s): INR, APTT in the last 8760 hours.  BMP: No results for input(s): NA, K, CL, CO2, GLUCOSE, BUN, CALCIUM, CREATININE, GFRNONAA, GFRAA in the last 8760 hours.  Invalid input(s): CMP  LIVER FUNCTION  TESTS: No results for input(s): BILITOT, AST, ALT, ALKPHOS, PROT, ALBUMIN in the last 8760 hours.  TUMOR MARKERS: No results for input(s): AFPTM, CEA, CA199, CHROMGRNA in the last 8760 hours.  Assessment and Plan:  49 y/o M with persistent proteinuria and worsening GFR concerning for possible FSGS who presents today for a random renal biopsy to further evaluate these lab findings.  Patient has been NPO since midnight, no anticoagulation/antiplatelets. Afebrile, WBC 7.0, hgb 15.2, plt 307, INR pending and will be reviewed prior to procedure.  Risks and benefits of random renal biopsy was discussed with the patient and/or patient's family including, but not limited to bleeding, infection, damage to adjacent structures or low yield requiring additional tests.  All of the questions were answered and there is agreement to proceed.  Consent signed and in chart.  Thank you for this interesting consult.  I greatly enjoyed meeting Scott Mcpherson and look forward to participating in their care.  A copy of this report was sent to the requesting provider on this date.  Electronically Signed: Villa Herb, PA-C 01/01/2020, 7:14 AM   I spent a total of 30 Minutes  in face to face in clinical consultation, greater than 50% of which was counseling/coordinating care for random renal biopsy.

## 2020-01-01 NOTE — Discharge Instructions (Addendum)
Percutaneous Kidney Biopsy, Care After This sheet gives you information about how to care for yourself after your procedure. Your health care provider may also give you more specific instructions. If you have problems or questions, contact your health care provider. What can I expect after the procedure? After the procedure, it is common to have:  Pain or soreness near the biopsy site.  Pink or cloudy urine for 24 hours after the procedure. Follow these instructions at home: Activity  Return to your normal activities as told by your health care provider. Ask your health care provider what activities are safe for you.  If you were given a sedative during the procedure, it can affect you for several hours. Do not drive or operate machinery until your health care provider says that it is safe.  Do not lift anything that is heavier than 10 lb (4.5 kg), or the limit that you are told, until your health care provider says that it is safe.  Avoid activities that take a lot of effort (are strenuous) until your health care provider approves. Most people will have to wait 2 weeks before returning to activities such as exercise or sex. General instructions   Take over-the-counter and prescription medicines only as told by your health care provider.  You may eat and drink after your procedure. Follow instructions from your health care provider about eating or drinking restrictions.  Check your biopsy site every day for signs of infection. Check for: ? More redness, swelling, or pain. ? Fluid or blood. ? Warmth. ? Pus or a bad smell.  Keep all follow-up visits as told by your health care provider. This is important. Contact a health care provider if:  You have more redness, swelling, or pain around your biopsy site.  You have fluid or blood coming from your biopsy site.  Your biopsy site feels warm to the touch.  You have pus or a bad smell coming from your biopsy site.  You have blood  in your urine more than 24 hours after your procedure.  You have a fever. Get help right away if:  Your urine is dark red or brown.  You cannot urinate.  It burns when you urinate.  You feel dizzy or light-headed.  You have severe pain in your abdomen or side. Summary  After the procedure, it is common to have pain or soreness at the biopsy site and pink or cloudy urine for the first 24 hours.  Check your biopsy site each day for signs of infection, such as more redness, swelling, or pain; fluid, blood, pus or a bad smell coming from the biopsy site; or the biopsy site feeling warm to touch.  Return to your normal activities as told by your health care provider. This information is not intended to replace advice given to you by your health care provider. Make sure you discuss any questions you have with your health care provider. Document Revised: 04/03/2019 Document Reviewed: 04/03/2019 Elsevier Patient Education  2020 Elsevier Inc. Moderate Conscious Sedation, Adult Sedation is the use of medicines to promote relaxation and relieve discomfort and anxiety. Moderate conscious sedation is a type of sedation. Under moderate conscious sedation, you are less alert than normal, but you are still able to respond to instructions, touch, or both. Moderate conscious sedation is used during short medical and dental procedures. It is milder than deep sedation, which is a type of sedation under which you cannot be easily woken up. It is also milder than   general anesthesia, which is the use of medicines to make you unconscious. Moderate conscious sedation allows you to return to your regular activities sooner. Tell a health care provider about:  Any allergies you have.  All medicines you are taking, including vitamins, herbs, eye drops, creams, and over-the-counter medicines.  Use of steroids (by mouth or creams).  Any problems you or family members have had with sedatives and anesthetic  medicines.  Any blood disorders you have.  Any surgeries you have had.  Any medical conditions you have, such as sleep apnea.  Whether you are pregnant or may be pregnant.  Any use of cigarettes, alcohol, marijuana, or street drugs. What are the risks? Generally, this is a safe procedure. However, problems may occur, including:  Getting too much medicine (oversedation).  Nausea.  Allergic reaction to medicines.  Trouble breathing. If this happens, a breathing tube may be used to help with breathing. It will be removed when you are awake and breathing on your own.  Heart trouble.  Lung trouble. What happens before the procedure? Staying hydrated Follow instructions from your health care provider about hydration, which may include:  Up to 2 hours before the procedure - you may continue to drink clear liquids, such as water, clear fruit juice, black coffee, and plain tea. Eating and drinking restrictions Follow instructions from your health care provider about eating and drinking, which may include:  8 hours before the procedure - stop eating heavy meals or foods such as meat, fried foods, or fatty foods.  6 hours before the procedure - stop eating light meals or foods, such as toast or cereal.  6 hours before the procedure - stop drinking milk or drinks that contain milk.  2 hours before the procedure - stop drinking clear liquids. Medicine Ask your health care provider about:  Changing or stopping your regular medicines. This is especially important if you are taking diabetes medicines or blood thinners.  Taking medicines such as aspirin and ibuprofen. These medicines can thin your blood. Do not take these medicines before your procedure if your health care provider instructs you not to.  Tests and exams  You will have a physical exam.  You may have blood tests done to show: ? How well your kidneys and liver are working. ? How well your blood can clot. General  instructions  Plan to have someone take you home from the hospital or clinic.  If you will be going home right after the procedure, plan to have someone with you for 24 hours. What happens during the procedure?  An IV tube will be inserted into one of your veins.  Medicine to help you relax (sedative) will be given through the IV tube.  The medical or dental procedure will be performed. What happens after the procedure?  Your blood pressure, heart rate, breathing rate, and blood oxygen level will be monitored often until the medicines you were given have worn off.  Do not drive for 24 hours. This information is not intended to replace advice given to you by your health care provider. Make sure you discuss any questions you have with your health care provider. Document Revised: 07/13/2017 Document Reviewed: 11/20/2015 Elsevier Patient Education  2020 Elsevier Inc.  

## 2020-01-01 NOTE — Procedures (Signed)
Interventional Radiology Procedure Note  Procedure: US guided left renal biopsy.  Medical renal.  .  Complications: None  Recommendations:  - Ok to shower tomorrow - Do not submerge for 7 days - Routine care - 2 hr dc home when goals met   Signed,  Dulcy Fanny. Earleen Newport, DO

## 2020-01-09 DIAGNOSIS — N1831 Chronic kidney disease, stage 3a: Secondary | ICD-10-CM | POA: Diagnosis not present

## 2020-01-13 DIAGNOSIS — E559 Vitamin D deficiency, unspecified: Secondary | ICD-10-CM | POA: Diagnosis not present

## 2020-01-13 DIAGNOSIS — N1831 Chronic kidney disease, stage 3a: Secondary | ICD-10-CM | POA: Diagnosis not present

## 2020-01-13 DIAGNOSIS — I129 Hypertensive chronic kidney disease with stage 1 through stage 4 chronic kidney disease, or unspecified chronic kidney disease: Secondary | ICD-10-CM | POA: Diagnosis not present

## 2020-01-13 LAB — SURGICAL PATHOLOGY

## 2020-01-14 ENCOUNTER — Encounter (HOSPITAL_COMMUNITY): Payer: Self-pay | Admitting: Nephrology

## 2020-05-17 DIAGNOSIS — N1831 Chronic kidney disease, stage 3a: Secondary | ICD-10-CM | POA: Diagnosis not present

## 2020-05-24 DIAGNOSIS — N1831 Chronic kidney disease, stage 3a: Secondary | ICD-10-CM | POA: Diagnosis not present

## 2020-05-24 DIAGNOSIS — R809 Proteinuria, unspecified: Secondary | ICD-10-CM | POA: Diagnosis not present

## 2020-05-24 DIAGNOSIS — E559 Vitamin D deficiency, unspecified: Secondary | ICD-10-CM | POA: Diagnosis not present

## 2020-05-24 DIAGNOSIS — I129 Hypertensive chronic kidney disease with stage 1 through stage 4 chronic kidney disease, or unspecified chronic kidney disease: Secondary | ICD-10-CM | POA: Diagnosis not present

## 2020-06-08 DIAGNOSIS — N1831 Chronic kidney disease, stage 3a: Secondary | ICD-10-CM | POA: Diagnosis not present

## 2020-12-05 IMAGING — US US BIOPSY
1 series · 13 of 13 positions shown · non-contrast
Comparison: none

INDICATION: 49-year-old male with a history of chronic renal failure

[Series 1: us biopsy (kidney) · 13 of 13 slices shown]
[im 1/13]
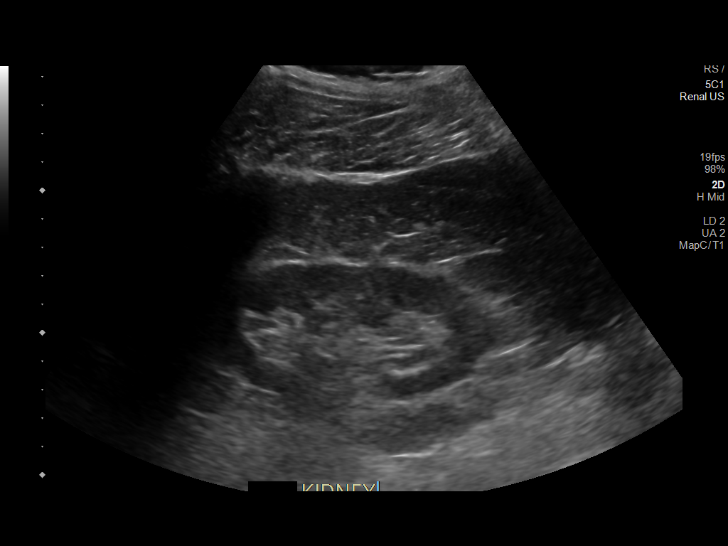
[im 2/13]
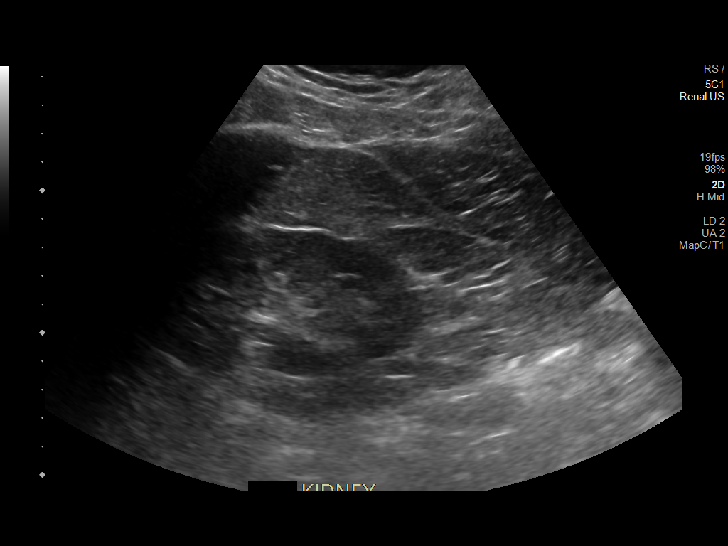
[im 3/13]
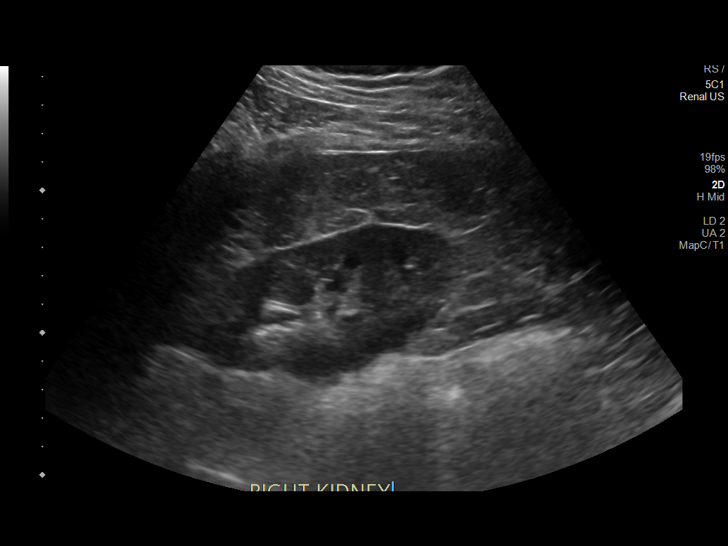
[im 4/13]
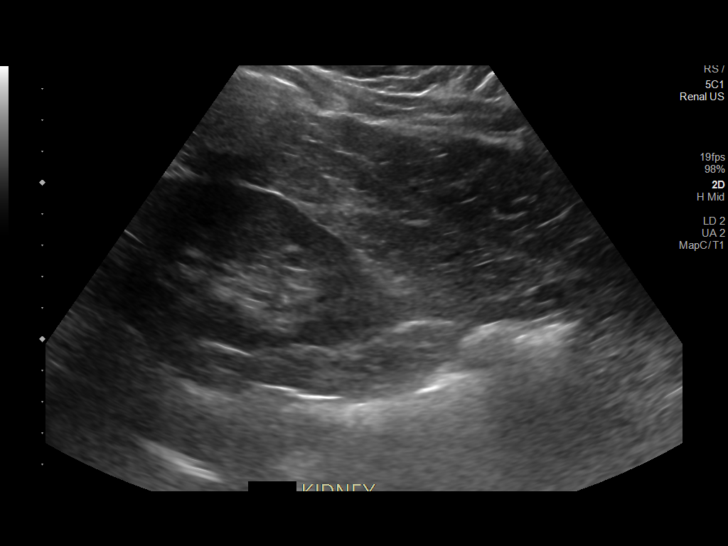
[im 5/13]
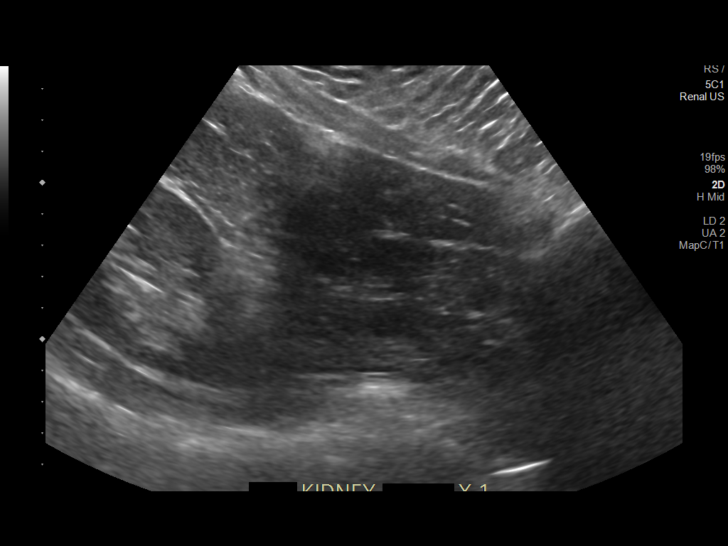
[im 6/13]
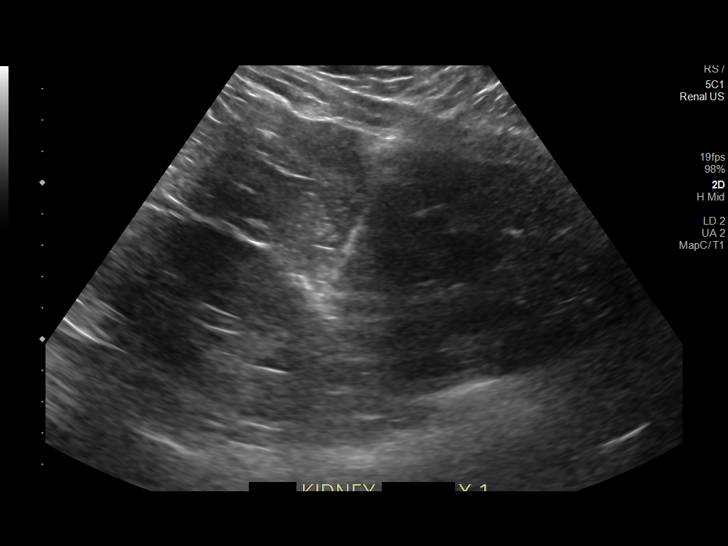
[im 7/13]
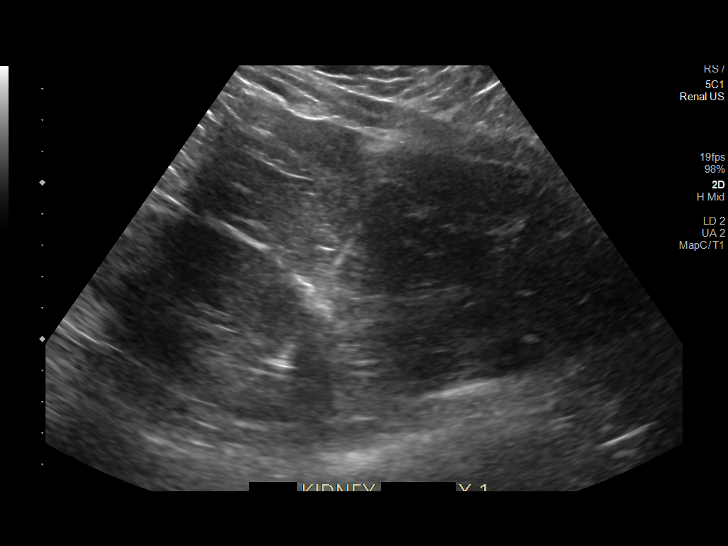
[im 8/13]
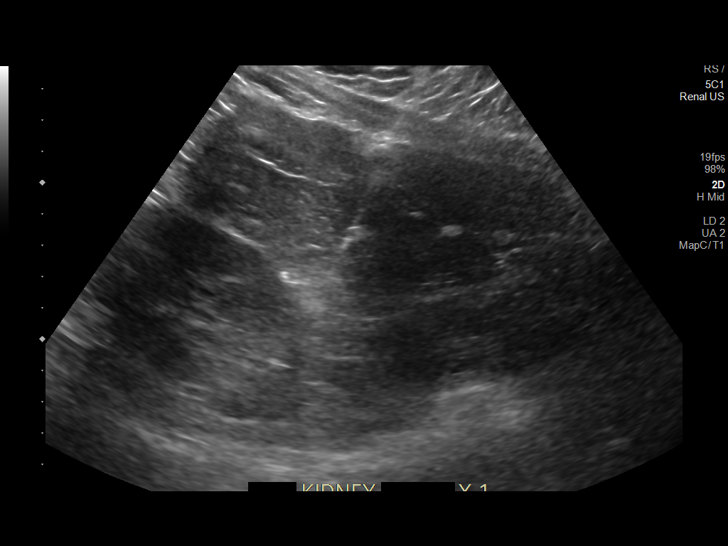
[im 9/13]
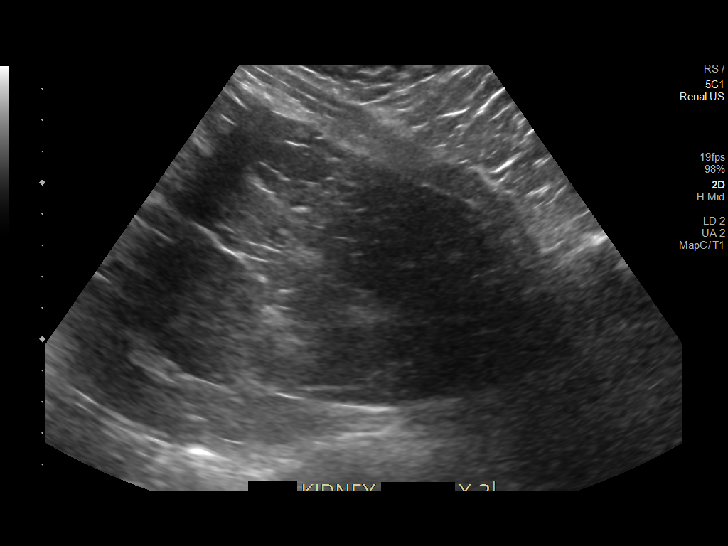
[im 10/13]
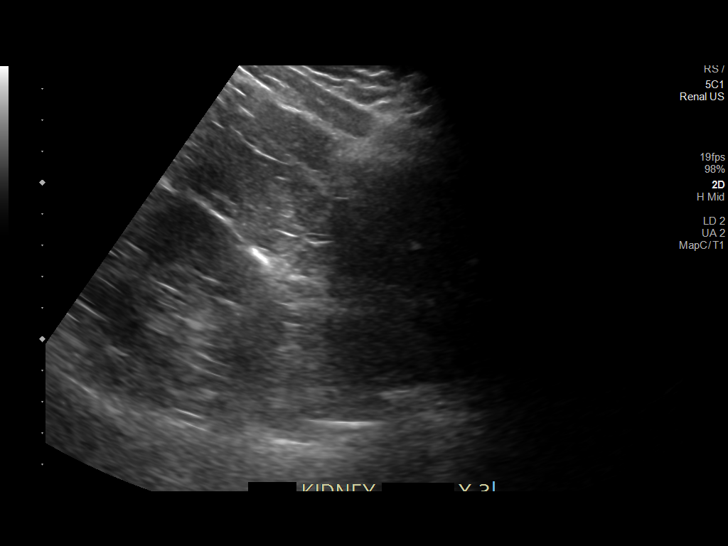
[im 11/13]
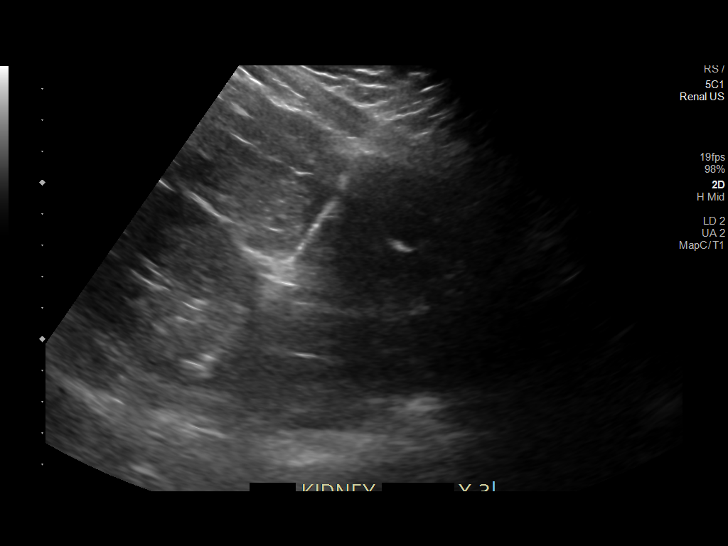
[im 12/13]
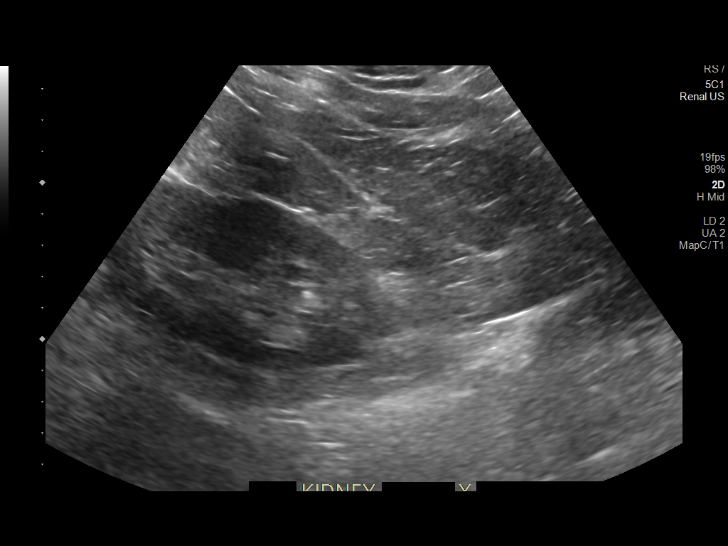
[im 13/13]
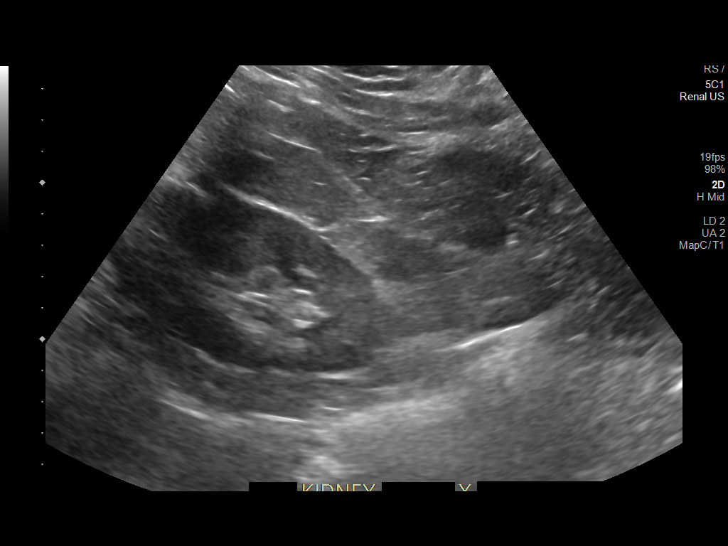

[13 of 13 positions shown; findings below may reference images not displayed]

EXAM:
ULTRASOUND-GUIDED MEDICAL RENAL BIOPSY

MEDICATIONS:
None.

ANESTHESIA/SEDATION:
Moderate (conscious) sedation was employed during this procedure. A
total of Versed 1.0 mg and Fentanyl 50 mcg was administered
intravenously.

Moderate Sedation Time: 13 minutes. The patient's level of
consciousness and vital signs were monitored continuously by
radiology nursing throughout the procedure under my direct
supervision.

FLUOROSCOPY TIME:  None

COMPLICATIONS:
None

PROCEDURE:
Informed written consent was obtained from the patient after a
thorough discussion of the procedural risks, benefits and
alternatives. All questions were addressed. Maximal Sterile Barrier
Technique was utilized including caps, mask, sterile gowns, sterile
gloves, sterile drape, hand hygiene and skin antiseptic. A timeout
was performed prior to the initiation of the procedure.

Patient was positioned prone position on the gantry table. Images
were stored sent to PACs.

Once the patient is prepped and draped in the usual sterile fashion,
the skin and subcutaneous tissues overlying the left kidney were
generously infiltrated 1% lidocaine for local anesthesia.

Using ultrasound guidance, a 15 gauge guide needle was advanced into
the lower cortex of the left kidney.

Once we confirmed location of the needle tip, 2 separate 16 gauge
core biopsy were achieved.

Two Gel-Foam pledgets were infused with a small amount of saline.
The needle was removed.

Final images were stored.

The patient tolerated the procedure well and remained
hemodynamically stable throughout.

No complications were encountered and no significant blood loss
encountered.
IMPRESSION: Status post ultrasound-guided left medical renal biopsy.

## 2021-08-25 ENCOUNTER — Emergency Department (HOSPITAL_BASED_OUTPATIENT_CLINIC_OR_DEPARTMENT_OTHER)
Admission: EM | Admit: 2021-08-25 | Discharge: 2021-08-25 | Disposition: A | Discharge status: Home / Self Care | Payer: BC Managed Care – PPO | Attending: Emergency Medicine | Admitting: Emergency Medicine

## 2021-08-25 ENCOUNTER — Other Ambulatory Visit: Payer: Self-pay

## 2021-08-25 ENCOUNTER — Encounter (HOSPITAL_BASED_OUTPATIENT_CLINIC_OR_DEPARTMENT_OTHER): Payer: Self-pay | Admitting: Emergency Medicine

## 2021-08-25 ENCOUNTER — Emergency Department (HOSPITAL_BASED_OUTPATIENT_CLINIC_OR_DEPARTMENT_OTHER): Payer: BC Managed Care – PPO

## 2021-08-25 DIAGNOSIS — R42 Dizziness and giddiness: Secondary | ICD-10-CM | POA: Diagnosis present

## 2021-08-25 DIAGNOSIS — Z79899 Other long term (current) drug therapy: Secondary | ICD-10-CM | POA: Diagnosis not present

## 2021-08-25 DIAGNOSIS — I10 Essential (primary) hypertension: Secondary | ICD-10-CM | POA: Insufficient documentation

## 2021-08-25 DIAGNOSIS — R0789 Other chest pain: Secondary | ICD-10-CM | POA: Diagnosis not present

## 2021-08-25 DIAGNOSIS — R002 Palpitations: Secondary | ICD-10-CM | POA: Insufficient documentation

## 2021-08-25 HISTORY — DX: Disorder of kidney and ureter, unspecified: N28.9

## 2021-08-25 HISTORY — DX: Pure hypercholesterolemia, unspecified: E78.00

## 2021-08-25 LAB — CBC WITH DIFFERENTIAL/PLATELET
Abs Immature Granulocytes: 0.01 10*3/uL (ref 0.00–0.07)
Basophils Absolute: 0.1 10*3/uL (ref 0.0–0.1)
Basophils Relative: 1 %
Eosinophils Absolute: 0.2 10*3/uL (ref 0.0–0.5)
Eosinophils Relative: 5 %
HCT: 47.4 % (ref 39.0–52.0)
Hemoglobin: 16.4 g/dL (ref 13.0–17.0)
Immature Granulocytes: 0 %
Lymphocytes Relative: 48 %
Lymphs Abs: 2.5 10*3/uL (ref 0.7–4.0)
MCH: 29.8 pg (ref 26.0–34.0)
MCHC: 34.6 g/dL (ref 30.0–36.0)
MCV: 86 fL (ref 80.0–100.0)
Monocytes Absolute: 0.6 10*3/uL (ref 0.1–1.0)
Monocytes Relative: 12 %
Neutro Abs: 1.7 10*3/uL (ref 1.7–7.7)
Neutrophils Relative %: 34 %
Platelets: 301 10*3/uL (ref 150–400)
RBC: 5.51 MIL/uL (ref 4.22–5.81)
RDW: 12.7 % (ref 11.5–15.5)
WBC: 5.1 10*3/uL (ref 4.0–10.5)
nRBC: 0 % (ref 0.0–0.2)

## 2021-08-25 LAB — MAGNESIUM: Magnesium: 2.1 mg/dL (ref 1.7–2.4)

## 2021-08-25 LAB — TSH: TSH: 0.825 u[IU]/mL (ref 0.350–4.500)

## 2021-08-25 LAB — BASIC METABOLIC PANEL
Anion gap: 11 (ref 5–15)
BUN: 25 mg/dL — ABNORMAL HIGH (ref 6–20)
CO2: 23 mmol/L (ref 22–32)
Calcium: 10 mg/dL (ref 8.9–10.3)
Chloride: 101 mmol/L (ref 98–111)
Creatinine, Ser: 1.97 mg/dL — ABNORMAL HIGH (ref 0.61–1.24)
GFR, Estimated: 41 mL/min — ABNORMAL LOW (ref >60)
Glucose, Bld: 115 mg/dL — ABNORMAL HIGH (ref 70–99)
Potassium: 4 mmol/L (ref 3.5–5.1)
Sodium: 135 mmol/L (ref 135–145)

## 2021-08-25 LAB — TROPONIN I (HIGH SENSITIVITY)
Troponin I (High Sensitivity): <2 ng/L (ref <18)
Troponin I (High Sensitivity): 2 ng/L (ref <18)

## 2021-08-25 NOTE — Discharge Instructions (Signed)
You have been seen and discharged from the emergency department.  Your blood work showed mild dehydration but otherwise is unremarkable.  Your cardiac work-up was negative.  Your thyroid hormone is still pending.  Follow-up with your primary provider and/or cardiology for further evaluation and further care.  I believe a Holter monitor would be a great neck step in evaluating and capturing your symptoms.  Take home medications as prescribed. If you have any worsening symptoms or further concerns for your health please return to an emergency department for further evaluation.

## 2021-08-25 NOTE — ED Provider Notes (Addendum)
[facility] EMERGENCY DEPT Provider Note   CSN: [id number] Arrival date & time: 08/25/21  0932     History  Chief Complaint  Patient presents with   Irregular Heart Beat    Scott Mcpherson is a 51 y.o. male.  HPI  51 year old male with past medical history of HTN, HLD presents to the emergency department with concern for irregular heart rhythm.  Patient states 3 days ago he had an episode where he felt lightheaded, had some chest tightness with palpitations.  At that time his watch alarmed that he was having atrial fibrillation.  This self resolved and his symptoms got better.  However this morning while at work the same thing happened.  He decided to come here for evaluation.  Currently the symptoms have resolved.  He has no ongoing chest pain, back pain, shortness of breath.  No swelling of his lower extremities.  He is recently prescribed a statin for high cholesterol but he has not started that yet.  No recent illness, fever, vomiting/diarrhea.  No family history of CAD/arrhythmias.  He denies any syncope.  Home Medications Prior to Admission medications   Medication Sig Start Date End Date Taking? Authorizing Provider  acetaminophen (TYLENOL) 500 MG tablet Take 1,000 mg by mouth every 6 (six) hours as needed for moderate pain or headache.    [provider]  amLODipine-benazepril (LOTREL) 10-40 MG capsule Take 1 capsule by mouth daily. 10/04/17   [provider]  cetirizine (ZYRTEC) 10 MG tablet Take 10 mg by mouth daily.    [provider]  Cholecalciferol (VITAMIN D) 50 MCG (2000 UT) tablet Take 2,000 Units by mouth 3 (three) times a week.    [provider]  hydrochlorothiazide (MICROZIDE) 12.5 MG capsule Take 12.5 mg by mouth daily.    [provider]  Ketotifen Fumarate (ALLERGY EYE DROPS OP) Place 1 drop into both eyes daily as needed (allergies).    [provider]  losartan (COZAAR) 50 MG tablet Take 50 mg  by mouth 2 (two) times daily.    [provider]  Multiple Vitamin (MULTIVITAMIN WITH MINERALS) TABS tablet Take 2 tablets by mouth daily.    [provider]      Allergies    Other and Pollen extract    Review of Systems   Review of Systems  Constitutional:  Negative for fever.  Respiratory:  Positive for chest tightness. Negative for shortness of breath.   Cardiovascular:  Positive for palpitations. Negative for chest pain and leg swelling.  Gastrointestinal:  Negative for abdominal pain, diarrhea and vomiting.  Musculoskeletal:  Negative for back pain.  Skin:  Negative for rash.  Neurological:  Negative for dizziness, syncope and headaches.   Physical Exam Updated Vital Signs BP 124/84    Pulse 62    Temp 98.2 F (36.8 C)    Resp 16    Ht 6\' 5"  (1.956 m)    Wt 113.4 kg    SpO2 99%    BMI 29.65 kg/m  Physical Exam Vitals and nursing note reviewed.  Constitutional:      General: He is not in acute distress.    Appearance: Normal appearance. He is not diaphoretic.  HENT:     Head: Normocephalic.     Mouth/Throat:     Mouth: Mucous membranes are moist.  Cardiovascular:     Rate and Rhythm: Normal rate.  Pulmonary:     Effort: Pulmonary effort is normal. No respiratory distress.  Abdominal:  Palpations: Abdomen is soft.     Tenderness: There is no abdominal tenderness.  Skin:    General: Skin is warm.  Neurological:     Mental Status: He is alert and oriented to person, place, and time. Mental status is at baseline.  Psychiatric:        Mood and Affect: Mood normal.    ED Results / Procedures / Treatments   Labs (all labs ordered are listed, but only abnormal results are displayed) Labs Reviewed  CBC WITH DIFFERENTIAL/PLATELET  BASIC METABOLIC PANEL  TSH  MAGNESIUM  TROPONIN I (HIGH SENSITIVITY)    EKG None  Radiology No results found.  Procedures Procedures    Medications Ordered in ED Medications - No data to display  ED  Course/ Medical Decision Making/ A&P                           Medical Decision Making  This patient presents to the ED for concern of irregular heart rhythm, this involves an extensive number of treatment options, and is a complaint that carries with it a high risk of complications and morbidity.  The differential diagnosis includes arrhythmia, PACs, PVCs   Additional history obtained: -External records from outside source obtained and reviewed including: Chart review including previous notes, labs, imaging, consultation notes   Lab Tests: -I ordered, reviewed, and interpreted labs.  The pertinent results include: Slightly worse than baseline CKD, normal electrolytes, negative troponin, normal magnesium   EKG -Normal sinus rhythm with no acute changes when compared to previous, intervals normal   Imaging Studies ordered: -I ordered imaging studies including chest x-ray -I independently visualized and interpreted imaging which showed no acute cardiopulmonary disease -I agree with the radiologist interpretation   ED Course: 51 year old male presents emergency department after concern for an episode of abnormal heart rhythm, his walked was signaling to him that he was in atrial fibrillation.  This is resolved, currently he is asymptomatic.  Denies any chest pain, back pain, shortness of breath, swelling of his lower extremities.  Low suspicion for PE.  On arrival he has normal vital signs, he is in normal sinus rhythm, normal intervals.  Blood work is reassuring.  Slightly worse than baseline CKD but normal troponin, magnesium.  TSH is pending.  Chest x-ray is unremarkable.  Plan for outpatient primary/cardiology follow-up and Holter monitor.   Cardiac Monitoring: The patient was maintained on a cardiac monitor.  I personally viewed and interpreted the cardiac monitored which showed an underlying rhythm of: Normal sinus rhythm   Reevaluation: After the interventions noted above, I  reevaluated the patient and found that they have :improved   Dispostion: Patient at this time appears safe and stable for discharge and close outpatient follow up. Discharge plan and strict return to ED precautions discussed, patient verbalizes understanding and agreement.        Final Clinical Impression(s) / ED Diagnoses Final diagnoses:  None    Rx / DC Orders ED Discharge Orders     None         Rozelle Logan, DO 08/25/21 1353    Jamill Wetmore, Clabe Seal, DO 08/25/21 1449

## 2021-08-25 NOTE — ED Notes (Signed)
Pt verbalizes understanding of discharge instructions. Opportunity for questions and answers were provided. Pt discharged from the ED.   ?

## 2021-08-25 NOTE — ED Triage Notes (Addendum)
States his watch told him that he was in atrial fib today and an episode 3 days ago. Reports he has had some intermittent dizziness. Also reports pressure in his chest. He reports he had a physical this week and was started on medication for high cholesterol

## 2023-01-17 ENCOUNTER — Other Ambulatory Visit (HOSPITAL_BASED_OUTPATIENT_CLINIC_OR_DEPARTMENT_OTHER): Payer: Self-pay | Admitting: Registered Nurse

## 2023-01-17 DIAGNOSIS — E041 Nontoxic single thyroid nodule: Secondary | ICD-10-CM

## 2023-01-18 ENCOUNTER — Ambulatory Visit (HOSPITAL_BASED_OUTPATIENT_CLINIC_OR_DEPARTMENT_OTHER): Payer: BC Managed Care – PPO
